# Patient Record
Sex: Female | Born: 1995 | Race: White | Hispanic: No | Marital: Single | Smoking: Never smoker
Health system: Southern US, Community
[De-identification: ages and names within clinical notes are randomized; demographics above are authoritative.]

---

## 2021-01-12 ENCOUNTER — Emergency Department (INDEPENDENT_AMBULATORY_CARE_PROVIDER_SITE_OTHER): Payer: No Typology Code available for payment source

## 2021-01-12 ENCOUNTER — Emergency Department (INDEPENDENT_AMBULATORY_CARE_PROVIDER_SITE_OTHER)
Admission: EM | Admit: 2021-01-12 | Discharge: 2021-01-12 | Disposition: A | Discharge status: Home / Self Care | Payer: No Typology Code available for payment source | Source: Home / Self Care | Attending: Family Medicine | Admitting: Family Medicine

## 2021-01-12 ENCOUNTER — Other Ambulatory Visit: Payer: Self-pay

## 2021-01-12 DIAGNOSIS — S60051A Contusion of right little finger without damage to nail, initial encounter: Secondary | ICD-10-CM | POA: Diagnosis not present

## 2021-01-12 DIAGNOSIS — M79644 Pain in right finger(s): Secondary | ICD-10-CM

## 2021-01-12 DIAGNOSIS — W208XXA Other cause of strike by thrown, projected or falling object, initial encounter: Secondary | ICD-10-CM | POA: Diagnosis not present

## 2021-01-12 NOTE — ED Provider Notes (Signed)
[facility]    CSN: [id number] Arrival date & time: 01/12/21  1701      History   Chief Complaint Chief Complaint  Patient presents with  . Finger Injury    R hand, 5th digit    HPI Adrienne Kirby is a 25 y.o. female.   She is presenting with an injury to the right distal phalanx of the right little finger.  It occurred yesterday while she was lifting.  Having some swelling and pain.  HPI  History reviewed. No pertinent past medical history.  There are no problems to display for this patient.   History reviewed. No pertinent surgical history.  OB History   No obstetric history on file.      Home Medications    Prior to Admission medications   Medication Sig Start Date End Date Taking? Authorizing Provider  acetaminophen (TYLENOL) 325 MG tablet Take 650 mg by mouth every 6 (six) hours as needed.   Yes [provider]    Family History Family History  Problem Relation Age of Onset  . Multiple sclerosis Mother   . Healthy Father     Social History Social History   Tobacco Use  . Smoking status: Never Smoker  . Smokeless tobacco: Never Used  Vaping Use  . Vaping Use: Never used  Substance Use Topics  . Alcohol use: Yes    Alcohol/week: 6.0 standard drinks    Types: 6 Standard drinks or equivalent per week    Comment: per month  . Drug use: Not Currently     Allergies   Patient has no known allergies.   Review of Systems Review of Systems  See HPI  Physical Exam Triage Vital Signs ED Triage Vitals  Enc Vitals Group     BP 01/12/21 1730 134/85     Pulse Rate 01/12/21 1730 76     Resp 01/12/21 1730 16     Temp 01/12/21 1730 98.8 F (37.1 C)     Temp Source 01/12/21 1730 Oral     SpO2 01/12/21 1730 98 %     Weight 01/12/21 1724 110 lb (49.9 kg)     Height 01/12/21 1724 5\' 5"  (1.651 m)     Head Circumference --      Peak Flow --      Pain Score 01/12/21 1723 4     Pain Loc --      Pain Edu? --      Excl. in GC? --     No data found.  Updated Vital Signs BP 134/85 (BP Location: Right Arm)   Pulse 76   Temp 98.8 F (37.1 C) (Oral)   Resp 16   Ht 5\' 5"  (1.651 m)   Wt 49.9 kg   LMP 01/04/2021 (Exact Date)   SpO2 98%   BMI 18.30 kg/m   Visual Acuity Right Eye Distance:   Left Eye Distance:   Bilateral Distance:    Right Eye Near:   Left Eye Near:    Bilateral Near:     Physical Exam Gen: NAD, alert, cooperative with exam, well-appearing ENT: normal lips, normal nasal mucosa,  Eye: normal EOM, normal conjunctiva and lids  Skin: no rashes, no areas of induration  Neuro: normal tone, normal sensation to touch Psych:  normal insight, alert and oriented MSK:  Right fifth finger: No malrotation or misalignment. Tenderness palpation of the distal phalanx. Neurovascular intact   UC Treatments / Results  Labs (all labs ordered are listed, but  only abnormal results are displayed) Labs Reviewed - No data to display  EKG   Radiology DG Finger Little Right  Result Date: 01/12/2021 CLINICAL DATA:  Right little finger pain after dropping a weight on finger last night. Pain about the distal interphalangeal joint. EXAM: RIGHT LITTLE FINGER 2+V COMPARISON:  None. FINDINGS: No acute fracture. The digit is held in flexion on all views at the proximal interphalangeal joint. No dislocation. Distal interphalangeal joint is normally aligned. No erosive change. Suggestion of mild soft tissue edema. IMPRESSION: 1. No acute fracture or dislocation. 2. Flexion of the digit at the proximal interphalangeal joint can be seen with tendon or ligamentous injury. Electronically Signed   By: Narda Rutherford M.D.   On: 01/12/2021 17:58    Procedures Procedures (including critical care time)  Medications Ordered in UC Medications - No data to display  Initial Impression / Assessment and Plan / UC Course  I have reviewed the triage vital signs and the nursing notes.  Pertinent labs & imaging results that were  available during my care of the patient were reviewed by me and considered in my medical decision making (see chart for details).     Ms. Borin is a 25 year old female is presenting with contusion of the little finger of the distal phalanx.  Imaging was negative for fracture.  Counseled on buddy taping and supportive care.  Given indications on follow-up.  Final Clinical Impressions(s) / UC Diagnoses   Final diagnoses:  Contusion of right little finger without damage to nail, initial encounter     Discharge Instructions     Please try ice  Please try buddy taping.  Please follow up if your symptoms fail to improve.     ED Prescriptions    None     PDMP not reviewed this encounter.   Myra Rude, MD 01/12/21 [phone]

## 2021-01-12 NOTE — ED Triage Notes (Signed)
Pt presents to Urgent Care with c/o pain and swelling to 5th finger of R hand following injury at the gym yesterday evening. Pt reports finger was smashed by a 15-lb dumbbell. She iced finger last night and has taken Tylenol for pain.

## 2021-01-12 NOTE — Discharge Instructions (Signed)
Please try ice  Please try buddy taping.  Please follow up if your symptoms fail to improve.

## 2021-11-07 IMAGING — DX DG FINGER LITTLE 2+V*R*
3 series · 3 of 3 positions shown · non-contrast
Comparison: None.

CLINICAL DATA: Right little finger pain after dropping a weight on
finger last night. Pain about the distal interphalangeal joint.

EXAM:
RIGHT LITTLE FINGER 2+V

[finger ap]
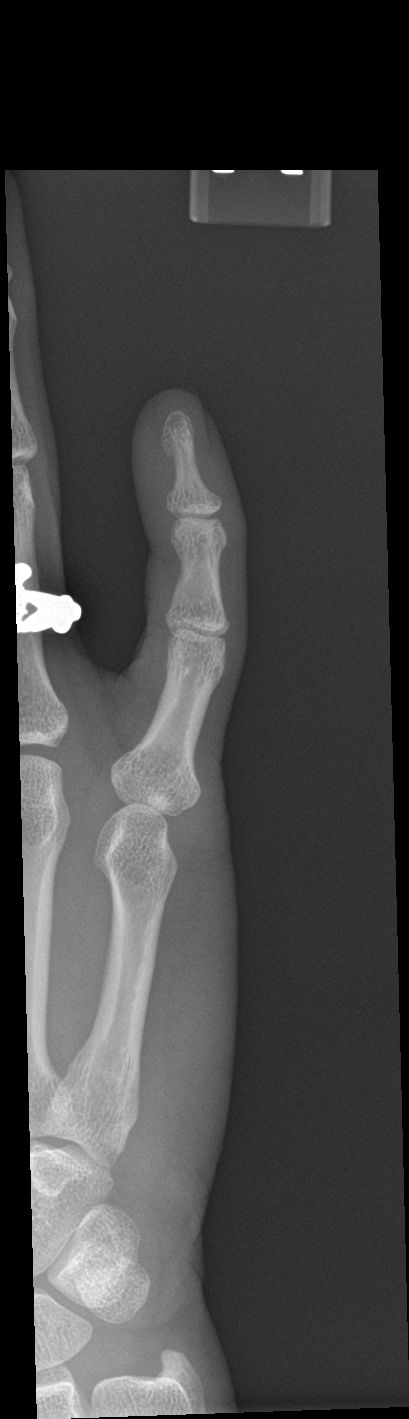

[finger obl]
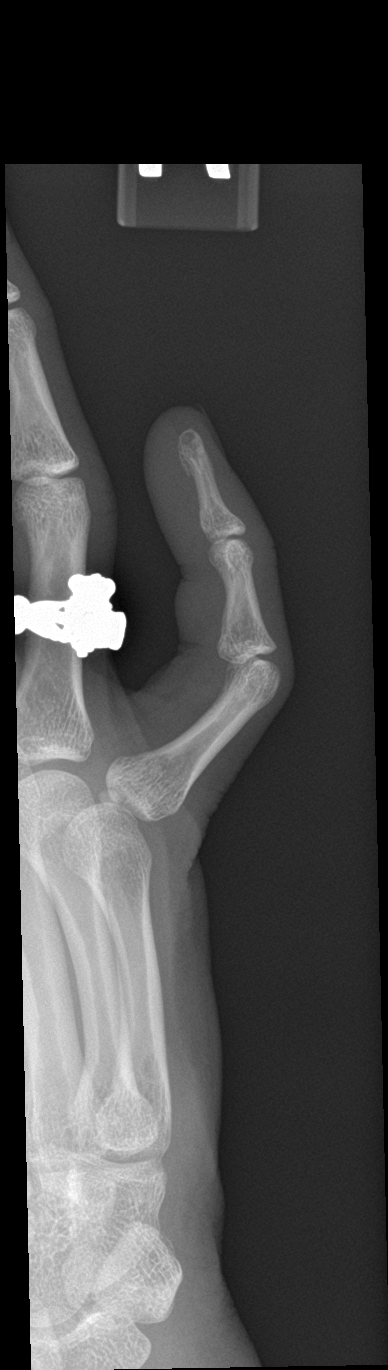

[finger lat]
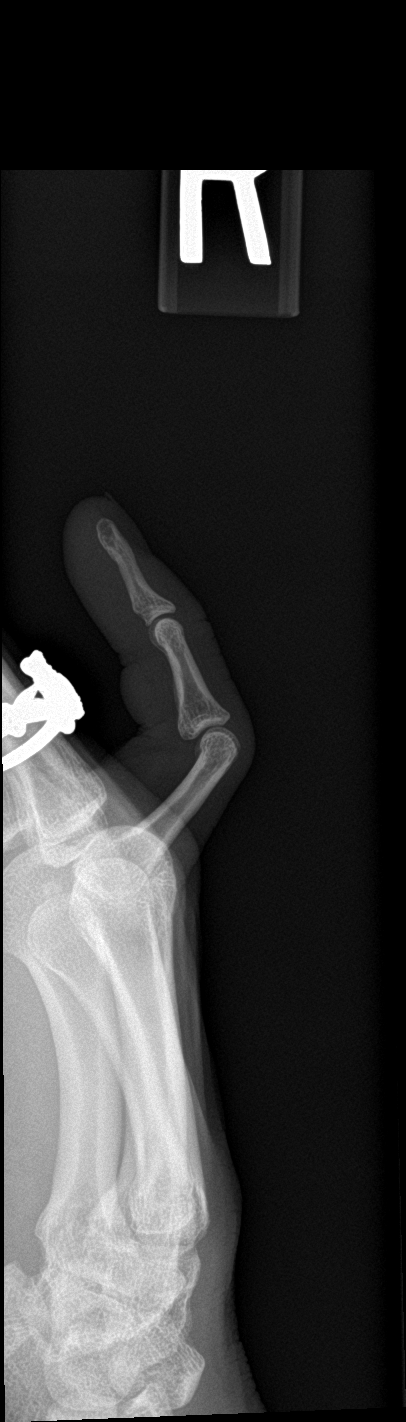

[3 of 3 positions shown; findings below may reference images not displayed]

FINDINGS: No acute fracture. The digit is held in flexion on all views at the
proximal interphalangeal joint. No dislocation. Distal
interphalangeal joint is normally aligned. No erosive change.
Suggestion of mild soft tissue edema.
IMPRESSION: 1. No acute fracture or dislocation.
2. Flexion of the digit at the proximal interphalangeal joint can be
seen with tendon or ligamentous injury.
